# Patient Record
Sex: Male | Born: 1995 | Race: Black or African American | Hispanic: No | Marital: Single | Smoking: Former smoker
Health system: Southern US, Community
[De-identification: ages and names within clinical notes are randomized; demographics above are authoritative.]

## PROBLEM LIST (undated history)

## (undated) DIAGNOSIS — I10 Essential (primary) hypertension: Secondary | ICD-10-CM

---

## 2017-02-09 ENCOUNTER — Ambulatory Visit: Payer: Self-pay | Admitting: Family Medicine

## 2017-02-26 ENCOUNTER — Emergency Department
Admission: EM | Admit: 2017-02-26 | Discharge: 2017-02-26 | Disposition: A | Discharge status: Home / Self Care | Payer: Self-pay | Attending: Emergency Medicine | Admitting: Emergency Medicine

## 2017-02-26 ENCOUNTER — Encounter: Payer: Self-pay | Admitting: Emergency Medicine

## 2017-02-26 ENCOUNTER — Emergency Department: Payer: Self-pay

## 2017-02-26 DIAGNOSIS — F17200 Nicotine dependence, unspecified, uncomplicated: Secondary | ICD-10-CM | POA: Insufficient documentation

## 2017-02-26 DIAGNOSIS — I10 Essential (primary) hypertension: Secondary | ICD-10-CM | POA: Insufficient documentation

## 2017-02-26 DIAGNOSIS — J9801 Acute bronchospasm: Secondary | ICD-10-CM | POA: Insufficient documentation

## 2017-02-26 HISTORY — DX: Essential (primary) hypertension: I10

## 2017-02-26 MED ORDER — MONTELUKAST SODIUM 10 MG PO TABS: 10.0000 mg | ORAL_TABLET | Freq: Every day | ORAL | 0 refills | Status: DC

## 2017-02-26 MED ORDER — PREDNISONE 20 MG PO TABS: 40.0000 mg | ORAL_TABLET | Freq: Every day | ORAL | 0 refills | Status: DC

## 2017-02-26 MED ORDER — ALBUTEROL SULFATE HFA 108 (90 BASE) MCG/ACT IN AERS: 2.0000 | INHALATION_SPRAY | RESPIRATORY_TRACT | 0 refills | Status: DC | PRN

## 2017-02-26 MED ORDER — ALBUTEROL SULFATE (2.5 MG/3ML) 0.083% IN NEBU
5.0000 mg | INHALATION_SOLUTION | Freq: Once | RESPIRATORY_TRACT | Status: AC
  Administered 2017-02-26: 5 mg via RESPIRATORY_TRACT

## 2017-02-26 NOTE — ED Triage Notes (Addendum)
Pt c/o SHOB since getting stung by a bee 2 days ago.  Unlabored at this time.  Controlling secretions mild tachypnea.  Expiratory wheezing on ausculation; no stridor or inspiratory wheezing.  No hx asthma. 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 Denies any pain. Denies cough

## 2017-02-26 NOTE — ED Provider Notes (Signed)
[facility] Emergency Department Provider Note  ____________________________________________  Time seen: Approximately 7:24 PM  I have reviewed the triage vital signs and the nursing notes.   HISTORY  Chief Complaint Shortness of Breath    HPI Sean Terry is a 21 y.o. male who complains of wheezing and chest tightness that started while he was cutting grass and using a weed eater. He does report seasonal allergies related to pollens. 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 He also reports any stung by a bee 2 days ago without any hives or rash or vomiting. No belly pain.     Past Medical History:  Diagnosis Date  . Hypertension      There are no active problems to display for this patient.    History reviewed. No pertinent surgical history.   Prior to Admission medications   Medication Sig Start Date End Date Taking? Authorizing Provider  albuterol (PROVENTIL HFA) 108 (90 Base) MCG/ACT inhaler Inhale 2 puffs into the lungs every 4 (four) hours as needed for wheezing or shortness of breath. 02/26/17   Sharman CheekStafford, Gavriella Hearst, MD  montelukast (SINGULAIR) 10 MG tablet Take 1 tablet (10 mg total) by mouth at bedtime. 02/26/17   Sharman CheekStafford, Khaleb Broz, MD  predniSONE (DELTASONE) 20 MG tablet Take 2 tablets (40 mg total) by mouth daily. 02/26/17   Sharman CheekStafford, Sherby Moncayo, MD     Allergies Patient has no known allergies.   History reviewed. No pertinent family history.  Social History Social History  Substance Use Topics  . Smoking status: Current Some Day Smoker  . Smokeless tobacco: Never Used  . Alcohol use No    Review of Systems  Constitutional:   No fever or chills.  ENT:   No sore throat. No rhinorrhea. Lymphatic: No swollen glands, No extremity swelling Endocrine: No hot/cold flashes. No significant weight change. No neck  swelling. Cardiovascular:   No chest pain or syncope. Respiratory:   Positive shortness of breath and nonproductive cough. Gastrointestinal:   Negative for abdominal pain, vomiting and diarrhea.  Genitourinary:   Negative for dysuria or difficulty urinating. Musculoskeletal:   Negative for focal pain or swelling Neurological:   Negative for headaches or weakness. All other systems reviewed and are negative except as documented above in ROS and HPI.  ____________________________________________   PHYSICAL EXAM:  VITAL SIGNS: ED Triage Vitals [02/26/17 1657]  Enc Vitals Group     BP (!) 151/85     Pulse Rate 99     Resp (!) 24     Temp 98.7 F (37.1 C)     Temp Source Oral     SpO2 94 %     Weight 240 lb (108.9 kg)     Height 5\' 10"  (1.778 m)     Head Circumference      Peak Flow      Pain Score      Pain Loc      Pain Edu?      Excl. in GC?     Vital signs reviewed, nursing assessments reviewed.   Constitutional:   Alert and oriented. Well appearing and in no distress. Eyes:   No scleral icterus. No conjunctival pallor. PERRL. EOMI.  No nystagmus. ENT   Head:   Normocephalic and atraumatic.   Nose:   No congestion/rhinnorhea. No septal hematoma   Mouth/Throat:  MMM, no pharyngeal erythema. No peritonsillar mass.    Neck:   No stridor. No SubQ emphysema. No meningismus. Hematological/Lymphatic/Immunilogical:   No cervical lymphadenopathy. Cardiovascular:   RRR. Symmetric bilateral radial and DP pulses.  No murmurs.  Respiratory:   Normal respiratory effort without tachypnea nor retractions. Diffuse expiratory wheezing with mildly prolonged expiratory phase. Exacerbated with forceful expiration. No focal crackles. Gastrointestinal:   Soft and nontender. Non distended. There is no CVA tenderness.  No rebound, rigidity, or guarding. Genitourinary:   deferred Musculoskeletal:   Normal range of motion in all extremities. No joint effusions.  No lower extremity  tenderness.  No edema. Neurologic:   Normal speech and language.  CN 2-10 normal. Motor grossly intact. No gross focal neurologic deficits are appreciated.  Skin:    Skin is warm, dry and intact. No rash noted.  No petechiae, purpura, or bullae.  ____________________________________________    LABS (pertinent positives/negatives) (all labs ordered are listed, but only abnormal results are displayed) Labs Reviewed - No data to display ____________________________________________   EKG    ____________________________________________    RADIOLOGY  Dg Chest 2 View  Result Date: 02/26/2017 CLINICAL DATA:  Shortness of breath EXAM: CHEST  2 VIEW COMPARISON:  None. FINDINGS: Normal heart size and mediastinal contours. No acute infiltrate or edema. No effusion or pneumothorax. No acute osseous findings. IMPRESSION: Negative chest. Electronically Signed   By: Marnee Spring M.D.   On: 02/26/2017 17:55    ____________________________________________   PROCEDURES Procedures  ____________________________________________   INITIAL IMPRESSION / ASSESSMENT AND PLAN / ED COURSE  Pertinent labs & imaging results that were available during my care of the patient were reviewed by me and considered in my medical decision making (see chart for details).  Pt well appearing, no acute distress, presentation not consistent with anaphylaxis or hypersensitivity. This appears to be bronchospasm related to seasonal allergies and possible asthma. Feeling better currently after prednisone and albuterol treatment. I'll continue him on prednisone singular and albuterol, follow-up with primary care. He has an appointment on May 30.  Considering the patient's symptoms, medical history, and physical examination today, I have low suspicion for ACS, PE, TAD, pneumothorax, carditis, mediastinitis, pneumonia, CHF, or sepsis.           ____________________________________________   FINAL CLINICAL  IMPRESSION(S) / ED DIAGNOSES  Final diagnoses:  Acute bronchospasm      New Prescriptions   ALBUTEROL (PROVENTIL HFA) 108 (90 BASE) MCG/ACT INHALER    Inhale 2 puffs into the lungs every 4 (four) hours as needed for wheezing or shortness of breath.   MONTELUKAST (SINGULAIR) 10 MG TABLET    Take 1 tablet (10 mg total) by mouth at bedtime.   PREDNISONE (DELTASONE) 20 MG TABLET    Take 2 tablets (40 mg total) by mouth daily.     Portions of this note were generated with dragon dictation software. Dictation errors may occur despite best attempts at proofreading.    Sharman Cheek, MD 02/26/17 [phone]

## 2017-02-26 NOTE — ED Triage Notes (Signed)
Pt remains to have expiratory wheezing after treatment and feels like lungs are tight. Does have bad allergie to pollen per pt. Remains without increased WOB.  Discussed with dr Suzanne Boronstaffored; will order CXR and wait for main/cpod.

## 2017-04-07 ENCOUNTER — Encounter: Payer: Self-pay | Admitting: Emergency Medicine

## 2017-04-07 ENCOUNTER — Emergency Department: Payer: Self-pay

## 2017-04-07 ENCOUNTER — Emergency Department
Admission: EM | Admit: 2017-04-07 | Discharge: 2017-04-07 | Disposition: A | Discharge status: Home / Self Care | Payer: Self-pay | Attending: Emergency Medicine | Admitting: Emergency Medicine

## 2017-04-07 DIAGNOSIS — R109 Unspecified abdominal pain: Secondary | ICD-10-CM

## 2017-04-07 DIAGNOSIS — F17200 Nicotine dependence, unspecified, uncomplicated: Secondary | ICD-10-CM | POA: Insufficient documentation

## 2017-04-07 DIAGNOSIS — I10 Essential (primary) hypertension: Secondary | ICD-10-CM | POA: Insufficient documentation

## 2017-04-07 DIAGNOSIS — R1032 Left lower quadrant pain: Secondary | ICD-10-CM | POA: Insufficient documentation

## 2017-04-07 LAB — COMPREHENSIVE METABOLIC PANEL
ALBUMIN: 4.5 g/dL (ref 3.5–5.0)
ALK PHOS: 84 U/L (ref 38–126)
ALT: 36 U/L (ref 17–63)
AST: 38 U/L (ref 15–41)
Anion gap: 6 (ref 5–15)
BUN: 12 mg/dL (ref 6–20)
CALCIUM: 9.5 mg/dL (ref 8.9–10.3)
CO2: 26 mmol/L (ref 22–32)
CREATININE: 1.1 mg/dL (ref 0.61–1.24)
Chloride: 104 mmol/L (ref 101–111)
GFR calc Af Amer: >60 mL/min (ref >60)
GFR calc non Af Amer: >60 mL/min (ref >60)
GLUCOSE: 112 mg/dL — AB (ref 65–99)
Potassium: 4 mmol/L (ref 3.5–5.1)
SODIUM: 136 mmol/L (ref 135–145)
Total Bilirubin: 0.7 mg/dL (ref 0.3–1.2)
Total Protein: 7.8 g/dL (ref 6.5–8.1)

## 2017-04-07 LAB — CBC
HCT: 45.3 % (ref 40.0–52.0)
Hemoglobin: 15.6 g/dL (ref 13.0–18.0)
MCH: 30.5 pg (ref 26.0–34.0)
MCHC: 34.3 g/dL (ref 32.0–36.0)
MCV: 88.9 fL (ref 80.0–100.0)
PLATELETS: 241 10*3/uL (ref 150–440)
RBC: 5.1 MIL/uL (ref 4.40–5.90)
RDW: 13.8 % (ref 11.5–14.5)
WBC: 5.1 10*3/uL (ref 3.8–10.6)

## 2017-04-07 LAB — URINALYSIS, COMPLETE (UACMP) WITH MICROSCOPIC
Bacteria, UA: NONE SEEN
Bilirubin Urine: NEGATIVE
GLUCOSE, UA: NEGATIVE mg/dL
Ketones, ur: NEGATIVE mg/dL
Leukocytes, UA: NEGATIVE
Nitrite: NEGATIVE
PROTEIN: NEGATIVE mg/dL
Specific Gravity, Urine: 1.025 (ref 1.005–1.030)
Squamous Epithelial / LPF: NONE SEEN
pH: 5 (ref 5.0–8.0)

## 2017-04-07 LAB — LIPASE, BLOOD: Lipase: 46 U/L (ref 11–51)

## 2017-04-07 MED ORDER — OXYCODONE-ACETAMINOPHEN 5-325 MG PO TABS
2.0000 | ORAL_TABLET | Freq: Once | ORAL | Status: AC
  Administered 2017-04-07: 2 via ORAL
  Filled 2017-04-07 (×2): qty 2

## 2017-04-07 NOTE — ED Provider Notes (Signed)
[facility] Emergency Department Provider Note       Time seen: ----------------------------------------- 9:12 AM on 04/07/2017 -----------------------------------------     I have reviewed the triage vital signs and the nursing notes.   HISTORY   Chief Complaint Abdominal Pain    HPI Sean Terry is a 21 y.o. male who presents to the ED for left-sided abdominal pain. Patient describes sharp and stabbing pain in the left flank, nothing makes it better or worse. He has had this happen before and he was told there was something wrong with his kidneys and that he was in kidney failure. Eventually this improved. He has had a history of hypertension and diabetes potentially as well. He denies any recent illness, denies being dehydrated. He denies any physical activity that would've pulled a muscle.   Past Medical History:  Diagnosis Date  . Hypertension     There are no active problems to display for this patient.   History reviewed. No pertinent surgical history.  Allergies Patient has no known allergies.  Social History Social History  Substance Use Topics  . Smoking status: Current Some Day Smoker  . Smokeless tobacco: Never Used  . Alcohol use No    Review of Systems Constitutional: Negative for fever. Cardiovascular: Negative for chest pain. Respiratory: Negative for shortness of breath. Gastrointestinal:Positive for left flank pain Genitourinary: Negative for dysuria. Musculoskeletal: Negative for back pain. Skin: Negative for rash. Neurological: Negative for headaches, focal weakness or numbness.  All systems negative/normal/unremarkable except as stated in the HPI  ____________________________________________   PHYSICAL EXAM:  VITAL SIGNS: ED Triage Vitals  Enc Vitals Group     BP 04/07/17 0851 133/63     Pulse Rate 04/07/17 0851 85     Resp 04/07/17 0851 15     Temp 04/07/17 0851 98.5 F (36.9 C)     Temp Source  04/07/17 0851 Oral     SpO2 04/07/17 0851 100 %     Weight 04/07/17 0850 240 lb (108.9 kg)     Height 04/07/17 0852 5\' 10"  (1.778 m)     Head Circumference --      Peak Flow --      Pain Score 04/07/17 0850 6     Pain Loc --      Pain Edu? --      Excl. in GC? --    Constitutional: Alert and oriented. Well appearing and in no distress. Eyes: Conjunctivae are normal. Normal extraocular movements. ENT   Head: Normocephalic and atraumatic.   Nose: No congestion/rhinnorhea.   Mouth/Throat: Mucous membranes are moist.   Neck: No stridor. Cardiovascular: Normal rate, regular rhythm. No murmurs, rubs, or gallops. Respiratory: Normal respiratory effort without tachypnea nor retractions. Breath sounds are clear and equal bilaterally. No wheezes/rales/rhonchi. Gastrointestinal: Soft and nontender. Normal bowel sounds Musculoskeletal: Nontender with normal range of motion in extremities. No lower extremity tenderness nor edema. Neurologic:  Normal speech and language. No gross focal neurologic deficits are appreciated.  Skin:  Skin is warm, dry and intact. No rash noted. Psychiatric: Mood and affect are normal. Speech and behavior are normal.  ____________________________________________  ED COURSE:  Pertinent labs & imaging results that were available during my care of the patient were reviewed by me and considered in my medical decision making (see chart for details). Patient presents for flank pain, we will assess with labs and imaging as indicated.   Procedures ____________________________________________   LABS (pertinent positives/negatives)  Labs Reviewed  COMPREHENSIVE METABOLIC PANEL -  Abnormal; Notable for the following:       Result Value   Glucose, Bld 112 (*)    All other components within normal limits  URINALYSIS, COMPLETE (UACMP) WITH MICROSCOPIC - Abnormal; Notable for the following:    Hgb urine dipstick TRACE (*)    All other components within normal  limits  LIPASE, BLOOD  CBC    RADIOLOGY Images were viewed by me  CT renal protocol IMPRESSION: 1. No acute abdominal or pelvic pathology. 2. No urolithiasis or obstructive uropathy. ____________________________________________  FINAL ASSESSMENT AND PLAN  Flank pain  Plan: Patient's labs and imaging were dictated above. Patient had presented for flank pain of uncertain etiology. No specific etiology was discovered. She is stable for outpatient follow-up.   Emily Filbert, MD   Note: This note was generated in part or whole with voice recognition software. Voice recognition is usually quite accurate but there are transcription errors that can and very often do occur. I apologize for any typographical errors that were not detected and corrected.     Emily Filbert, MD 04/07/17 1036

## 2017-04-07 NOTE — ED Notes (Signed)
Pt states he drove himself to ER, unable to verify other means of transportation at D/C. Will let RN know when patient has secured a ride home and pain medication will be administered. MD aware. Pt states understanding.

## 2017-04-07 NOTE — ED Notes (Signed)
NAD noted at time of D/C. Pt denies questions or concerns. Pt ambulatory to the lobby at this time.  

## 2017-04-07 NOTE — ED Triage Notes (Signed)
Pt presents with left side abd pain .

## 2018-12-25 ENCOUNTER — Emergency Department
Admission: EM | Admit: 2018-12-25 | Discharge: 2018-12-25 | Disposition: A | Discharge status: Home / Self Care | Payer: Self-pay | Attending: Emergency Medicine | Admitting: Emergency Medicine

## 2018-12-25 ENCOUNTER — Encounter: Payer: Self-pay | Admitting: Emergency Medicine

## 2018-12-25 ENCOUNTER — Emergency Department: Payer: Self-pay

## 2018-12-25 ENCOUNTER — Other Ambulatory Visit: Payer: Self-pay

## 2018-12-25 DIAGNOSIS — F17200 Nicotine dependence, unspecified, uncomplicated: Secondary | ICD-10-CM | POA: Insufficient documentation

## 2018-12-25 DIAGNOSIS — Y998 Other external cause status: Secondary | ICD-10-CM | POA: Insufficient documentation

## 2018-12-25 DIAGNOSIS — I10 Essential (primary) hypertension: Secondary | ICD-10-CM | POA: Insufficient documentation

## 2018-12-25 DIAGNOSIS — Y929 Unspecified place or not applicable: Secondary | ICD-10-CM | POA: Insufficient documentation

## 2018-12-25 DIAGNOSIS — W109XXA Fall (on) (from) unspecified stairs and steps, initial encounter: Secondary | ICD-10-CM | POA: Insufficient documentation

## 2018-12-25 DIAGNOSIS — S93402A Sprain of unspecified ligament of left ankle, initial encounter: Secondary | ICD-10-CM | POA: Insufficient documentation

## 2018-12-25 DIAGNOSIS — Y9301 Activity, walking, marching and hiking: Secondary | ICD-10-CM | POA: Insufficient documentation

## 2018-12-25 NOTE — Discharge Instructions (Addendum)
Your exam and x-ray are consistent with a Grade II-III sprain. Wear the ankle stirrup or your home boot for support. Perform ROM exercises as discussed. Take OTC ibuprofen or Naproxen for pain & swelling. Follow-up with Podiatry for ongoing symptoms.

## 2018-12-25 NOTE — ED Provider Notes (Signed)
[facility] Emergency Department Provider Note ____________________________________________  Time seen: 1833  I have reviewed the triage vital signs and the nursing notes.  HISTORY  Chief Complaint  Ankle Pain  HPI Sean Terry is a 23 y.o. male presents himself to the ED for evaluation of pain to the left ankle.  Patient describes a mechanical fall while walking down the steps with his toddler in arms.  He rolled his ankle, and has had increasing pain, swelling, and disability to the ankle in the last 3 days.  He denies any other injury at this time.  Past Medical History:  Diagnosis Date  . Hypertension     There are no active problems to display for this patient.  History reviewed. No pertinent surgical history.  Prior to Admission medications   Not on File   Allergies Patient has no known allergies.  No family history on file.  Social History Social History   Tobacco Use  . Smoking status: Current Some Day Smoker  . Smokeless tobacco: Never Used  Substance Use Topics  . Alcohol use: No  . Drug use: No    Review of Systems  Constitutional: Negative for fever. Cardiovascular: Negative for chest pain. Respiratory: Negative for shortness of breath. Musculoskeletal: Negative for back pain.  Ankle pain as above. Skin: Negative for rash. Neurological: Negative for headaches, focal weakness or numbness. ____________________________________________  PHYSICAL EXAM:  VITAL SIGNS: ED Triage Vitals  Enc Vitals Group     BP 12/25/18 1718 (!) 152/96     Pulse Rate 12/25/18 1718 88     Resp 12/25/18 1718 16     Temp 12/25/18 1718 98.2 F (36.8 C)     Temp Source 12/25/18 1718 Oral     SpO2 12/25/18 1718 97 %     Weight 12/25/18 1719 225 lb (102.1 kg)     Height 12/25/18 1719 5\' 9"  (1.753 m)     Head Circumference --      Peak Flow --      Pain Score 12/25/18 1719 0     Pain Loc --      Pain Edu? --      Excl. in GC? --      Constitutional: Alert and oriented. Well appearing and in no distress. Head: Normocephalic and atraumatic. Eyes: Conjunctivae are normal. Normal extraocular movements Cardiovascular: Normal rate, regular rhythm. Normal distal pulses capillary refill. Respiratory: Normal respiratory effort.  Musculoskeletal: Left foot and ankle with moderate soft tissue swelling and lateral ecchymosis noted.  Patient with decreased active range of motion ankle with all planes secondary to pain and swelling.  No calf or Achilles tenderness is elicited.  Negative anterior/posterior drawer.  Nontender with normal range of motion in all extremities.  Neurologic: Antalgic gait without ataxia. Normal speech and language. No gross focal neurologic deficits are appreciated. Skin:  Skin is warm, dry and intact. No rash noted. ____________________________________________   RADIOLOGY  Left Ankle  Negative ____________________________________________  PROCEDURES  Procedures Velcro Ankle Stirrup Splint Crutches ____________________________________________  INITIAL IMPRESSION / ASSESSMENT AND PLAN / ED COURSE  Patient with ED evaluation of left ankle pain as well as following mechanical fall.  Sustained a grade 2-3 sprain to the left ankle.  X-rays are reassuring as it shows no acute fracture or dislocation.  Patient is placed in a Velcro stirrup splint and given crutches to ambulate with weightbearing as tolerated.  He is referred to podiatry for ongoing symptomatic.  A work note is provided for  2 days as requested.  Take over-the-counter ibuprofen or naproxen as needed. ____________________________________________  FINAL CLINICAL IMPRESSION(S) / ED DIAGNOSES  Final diagnoses:  Sprain of left ankle, unspecified ligament, initial encounter      Lissa Hoard, PA-C 12/25/18 1852    Phineas Semen, MD 12/25/18 2042

## 2018-12-25 NOTE — ED Triage Notes (Signed)
Pt to ED via POV c/o left ankle pain s/p fall 2 days ago. Pt states that pain has continued to get worse. Pt states that he has increased pain when trying to put pressure on foot. Pt is in NAD.

## 2020-07-13 ENCOUNTER — Other Ambulatory Visit: Payer: Self-pay

## 2020-07-13 ENCOUNTER — Emergency Department: Payer: Self-pay

## 2020-07-13 ENCOUNTER — Emergency Department
Admission: EM | Admit: 2020-07-13 | Discharge: 2020-07-13 | Disposition: A | Discharge status: Home / Self Care | Payer: Self-pay | Attending: Emergency Medicine | Admitting: Emergency Medicine

## 2020-07-13 DIAGNOSIS — Z5321 Procedure and treatment not carried out due to patient leaving prior to being seen by health care provider: Secondary | ICD-10-CM | POA: Insufficient documentation

## 2020-07-13 DIAGNOSIS — R079 Chest pain, unspecified: Secondary | ICD-10-CM | POA: Insufficient documentation

## 2020-07-13 DIAGNOSIS — I10 Essential (primary) hypertension: Secondary | ICD-10-CM | POA: Insufficient documentation

## 2020-07-13 LAB — BASIC METABOLIC PANEL
Anion gap: 10 (ref 5–15)
BUN: 17 mg/dL (ref 6–20)
CO2: 26 mmol/L (ref 22–32)
Calcium: 9.8 mg/dL (ref 8.9–10.3)
Chloride: 103 mmol/L (ref 98–111)
Creatinine, Ser: 1.42 mg/dL — ABNORMAL HIGH (ref 0.61–1.24)
GFR calc Af Amer: >60 mL/min (ref >60)
GFR calc non Af Amer: >60 mL/min (ref >60)
Glucose, Bld: 100 mg/dL — ABNORMAL HIGH (ref 70–99)
Potassium: 4.4 mmol/L (ref 3.5–5.1)
Sodium: 139 mmol/L (ref 135–145)

## 2020-07-13 LAB — CBC
HCT: 45 % (ref 39.0–52.0)
Hemoglobin: 16 g/dL (ref 13.0–17.0)
MCH: 31.1 pg (ref 26.0–34.0)
MCHC: 35.6 g/dL (ref 30.0–36.0)
MCV: 87.5 fL (ref 80.0–100.0)
Platelets: 277 10*3/uL (ref 150–400)
RBC: 5.14 MIL/uL (ref 4.22–5.81)
RDW: 12.6 % (ref 11.5–15.5)
WBC: 8.2 10*3/uL (ref 4.0–10.5)
nRBC: 0 % (ref 0.0–0.2)

## 2020-07-13 LAB — TROPONIN I (HIGH SENSITIVITY): Troponin I (High Sensitivity): 4 ng/L (ref <18)

## 2020-07-13 NOTE — ED Notes (Signed)
No answer when called for repeat troponin.  

## 2020-07-13 NOTE — ED Triage Notes (Signed)
Pt comes POV with chest pain for 2 days . Pt concerned about htn coming back, used to be on meds but got taken off. States felt like a muscle spasm in his chest that made him almost vomit.

## 2020-07-13 NOTE — ED Notes (Signed)
No answer when called 

## 2020-07-14 ENCOUNTER — Emergency Department
Admission: EM | Admit: 2020-07-14 | Discharge: 2020-07-14 | Disposition: A | Discharge status: Home / Self Care | Payer: Self-pay | Attending: Emergency Medicine | Admitting: Emergency Medicine

## 2020-07-14 ENCOUNTER — Other Ambulatory Visit: Payer: Self-pay

## 2020-07-14 ENCOUNTER — Encounter: Payer: Self-pay | Admitting: Emergency Medicine

## 2020-07-14 DIAGNOSIS — I10 Essential (primary) hypertension: Secondary | ICD-10-CM | POA: Insufficient documentation

## 2020-07-14 DIAGNOSIS — F17200 Nicotine dependence, unspecified, uncomplicated: Secondary | ICD-10-CM | POA: Insufficient documentation

## 2020-07-14 DIAGNOSIS — Z20822 Contact with and (suspected) exposure to covid-19: Secondary | ICD-10-CM | POA: Insufficient documentation

## 2020-07-14 DIAGNOSIS — J029 Acute pharyngitis, unspecified: Secondary | ICD-10-CM | POA: Insufficient documentation

## 2020-07-14 LAB — GROUP A STREP BY PCR: Group A Strep by PCR: NOT DETECTED

## 2020-07-14 LAB — RESPIRATORY PANEL BY RT PCR (FLU A&B, COVID)
Influenza A by PCR: NEGATIVE
Influenza B by PCR: NEGATIVE
SARS Coronavirus 2 by RT PCR: NEGATIVE

## 2020-07-14 NOTE — ED Notes (Signed)
Pt c/o "swollen uvula" causing irritation and cough. Pt denies fever, N/V/D, exposure to illness. Pt states he and his friends were recently tested for covid, which was negative.

## 2020-07-14 NOTE — ED Provider Notes (Signed)
[facility] Emergency Department Provider Note  ____________________________________________   First MD Initiated Contact with Patient 07/14/20 1537     (approximate)  I have reviewed the triage vital signs and the nursing notes.   HISTORY  Chief Complaint Sore Throat  HPI Sean Terry is a 24 y.o. male for report of feeling like his uvula is swollen.  Of note, he came to the emergency room last night and was complaining of chest pain but denies that at this time.  He has been trying to treat his sore throat with over-the-counter lozenges.  He states that has been going on for 4 days and tried to treat it with some old medicine that he had for strep throat.  He states he begins with associated is unsure exactly what it is.  He denies headache, denies chest pain, denies shortness of breath, denies abdominal pain.  Denies known sick contacts.         Past Medical History:  Diagnosis Date  . Hypertension     There are no problems to display for this patient.   History reviewed. No pertinent surgical history.  Prior to Admission medications   Not on File    Allergies Patient has no known allergies.  History reviewed. No pertinent family history.  Social History Social History   Tobacco Use  . Smoking status: Current Some Day Smoker  . Smokeless tobacco: Never Used  Substance Use Topics  . Alcohol use: No  . Drug use: No    Review of Systems Constitutional: No fever/chills Eyes: No visual changes. ENT: + sore throat. Cardiovascular: Denies chest pain. Respiratory: Denies shortness of breath. Gastrointestinal: No abdominal pain.  No nausea, no vomiting.  No diarrhea.  No constipation. Genitourinary: Negative for dysuria. Musculoskeletal: Negative for back pain. Skin: Negative for rash. Neurological: Negative for headaches, focal weakness or numbness.  ____________________________________________   PHYSICAL EXAM:  VITAL  SIGNS: ED Triage Vitals  Enc Vitals Group     BP 07/14/20 1359 (!) 137/97     Pulse Rate 07/14/20 1359 (!) 102     Resp 07/14/20 1359 18     Temp 07/14/20 1359 98.2 F (36.8 C)     Temp Source 07/14/20 1359 Oral     SpO2 07/14/20 1359 100 %     Weight 07/14/20 1400 240 lb (108.9 kg)     Height 07/14/20 1400 5\' 9"  (1.753 m)     Head Circumference --      Peak Flow --      Pain Score 07/14/20 1405 7     Pain Loc --      Pain Edu? --      Excl. in GC? --     Constitutional: Alert and oriented. Well appearing and in no acute distress. Eyes: Conjunctivae are normal. PERRL. EOMI. Head: Atraumatic. Nose: No congestion/rhinnorhea. Mouth/Throat: Mucous membranes are moist.  Oropharynx non-erythematous.  No exudates.  Tonsils are appropriate size. Neck: No stridor.  No cervical lymphadenopathy. Cardiovascular: Normal rate, regular rhythm. Grossly normal heart sounds.  Good peripheral circulation. Respiratory: Normal respiratory effort.  No retractions. Lungs CTAB. Gastrointestinal: Soft and nontender. No distention. No abdominal bruits. No CVA tenderness. Musculoskeletal: No lower extremity tenderness nor edema.  No joint effusions. Neurologic:  Normal speech and language. No gross focal neurologic deficits are appreciated. No gait instability. Skin:  Skin is warm, dry and intact. No rash noted. Psychiatric: Mood and affect are normal. Speech and behavior are normal.  ____________________________________________  LABS (all labs ordered are listed, but only abnormal results are displayed)  Labs Reviewed  RESPIRATORY PANEL BY RT PCR (FLU A&B, COVID)  GROUP A STREP BY PCR    ____________________________________________  RADIOLOGY  Official radiology report(s): DG Chest 2 View  Result Date: 07/13/2020 CLINICAL DATA:  Chest pain. EXAM: CHEST - 2 VIEW COMPARISON:  Feb 26, 2017 FINDINGS: The heart size and mediastinal contours are within normal limits. Both lungs are clear. The  visualized skeletal structures are unremarkable. IMPRESSION: No active cardiopulmonary disease. Electronically Signed   By: Katherine Mantle M.D.   On: 07/13/2020 17:59    ____________________________________________   INITIAL IMPRESSION / ASSESSMENT AND PLAN / ED COURSE  As part of my medical decision making, I reviewed the following data within the electronic MEDICAL RECORD NUMBER Nursing notes reviewed and incorporated and Notes from prior ED visits        Sean Terry is a 24 year old male who presents to the emergency department for feeling like his uvula is swollen.  He feels that this is similar to when he had strep pharyngitis in the past.  He has been trying to treat with old antibiotics that he had but feels that is not improving between that and symptomatic therapy.  Of note he denies a cough and denies sick contacts.  Physical exam is completely normal with no erythematous oropharynx and no tonsillar exudate.  The patient does not have any cervical lymphadenopathy, making strep pharyngitis extremely unlikely.  However will test for for group A strep.  We will also test for Covid.  Both of these tests returned negative.  Is likely to be a viral pharyngitis given the negative test.  Recommended that the patient treat symptomatically, taking orally with ibuprofen as needed for his throat.  The patient is amenable with this plan will return to the emergency department for any worsening.      ____________________________________________   FINAL CLINICAL IMPRESSION(S) / ED DIAGNOSES  Final diagnoses:  Viral pharyngitis     ED Discharge Orders    None      *Please note:  Sean Terry was evaluated in Emergency Department on 07/14/2020 for the symptoms described in the history of present illness. He was evaluated in the context of the global COVID-19 pandemic, which necessitated consideration that the patient might be at risk for infection with the SARS-CoV-2 virus that causes  COVID-19. Institutional protocols and algorithms that pertain to the evaluation of patients at risk for COVID-19 are in a state of rapid change based on information released by regulatory bodies including the CDC and federal and state organizations. These policies and algorithms were followed during the patient's care in the ED.  Some ED evaluations and interventions may be delayed as a result of limited staffing during and the pandemic.*   Note:  This document was prepared using Dragon voice recognition software and may include unintentional dictation errors.    Lucy Chris, PA 07/14/20 1743    Merwyn Katos, MD 07/14/20 [phone]

## 2020-07-14 NOTE — ED Triage Notes (Signed)
Pt arrived via POV with c/o sore throat x 4 days, pt states he thinks his uvula is swollen. Pt states he took left over antibiotics at home for a few days that starts with a C but states no improvement.  Uvula does not appear to be swollen at this time when examined with light.

## 2020-07-17 ENCOUNTER — Other Ambulatory Visit: Payer: Self-pay

## 2020-07-17 ENCOUNTER — Encounter: Payer: Self-pay | Admitting: Emergency Medicine

## 2020-07-17 ENCOUNTER — Emergency Department
Admission: EM | Admit: 2020-07-17 | Discharge: 2020-07-17 | Disposition: A | Discharge status: Home / Self Care | Payer: Self-pay | Attending: Student in an Organized Health Care Education/Training Program | Admitting: Student in an Organized Health Care Education/Training Program

## 2020-07-17 ENCOUNTER — Emergency Department: Payer: Self-pay

## 2020-07-17 DIAGNOSIS — I10 Essential (primary) hypertension: Secondary | ICD-10-CM | POA: Insufficient documentation

## 2020-07-17 DIAGNOSIS — F17200 Nicotine dependence, unspecified, uncomplicated: Secondary | ICD-10-CM | POA: Insufficient documentation

## 2020-07-17 DIAGNOSIS — J029 Acute pharyngitis, unspecified: Secondary | ICD-10-CM | POA: Insufficient documentation

## 2020-07-17 LAB — CBC WITH DIFFERENTIAL/PLATELET
Abs Immature Granulocytes: 0.01 10*3/uL (ref 0.00–0.07)
Basophils Absolute: 0 10*3/uL (ref 0.0–0.1)
Basophils Relative: 1 %
Eosinophils Absolute: 0 10*3/uL (ref 0.0–0.5)
Eosinophils Relative: 0 %
HCT: 41.2 % (ref 39.0–52.0)
Hemoglobin: 14.5 g/dL (ref 13.0–17.0)
Immature Granulocytes: 0 %
Lymphocytes Relative: 30 %
Lymphs Abs: 1.6 10*3/uL (ref 0.7–4.0)
MCH: 31.3 pg (ref 26.0–34.0)
MCHC: 35.2 g/dL (ref 30.0–36.0)
MCV: 89 fL (ref 80.0–100.0)
Monocytes Absolute: 0.4 10*3/uL (ref 0.1–1.0)
Monocytes Relative: 7 %
Neutro Abs: 3.4 10*3/uL (ref 1.7–7.7)
Neutrophils Relative %: 62 %
Platelets: 234 10*3/uL (ref 150–400)
RBC: 4.63 MIL/uL (ref 4.22–5.81)
RDW: 12.4 % (ref 11.5–15.5)
WBC: 5.4 10*3/uL (ref 4.0–10.5)
nRBC: 0 % (ref 0.0–0.2)

## 2020-07-17 LAB — COMPREHENSIVE METABOLIC PANEL
ALT: 23 U/L (ref 0–44)
AST: 26 U/L (ref 15–41)
Albumin: 4.7 g/dL (ref 3.5–5.0)
Alkaline Phosphatase: 68 U/L (ref 38–126)
Anion gap: 9 (ref 5–15)
BUN: 14 mg/dL (ref 6–20)
CO2: 24 mmol/L (ref 22–32)
Calcium: 9.7 mg/dL (ref 8.9–10.3)
Chloride: 106 mmol/L (ref 98–111)
Creatinine, Ser: 1.35 mg/dL — ABNORMAL HIGH (ref 0.61–1.24)
GFR calc Af Amer: >60 mL/min (ref >60)
GFR calc non Af Amer: >60 mL/min (ref >60)
Glucose, Bld: 110 mg/dL — ABNORMAL HIGH (ref 70–99)
Potassium: 3.5 mmol/L (ref 3.5–5.1)
Sodium: 139 mmol/L (ref 135–145)
Total Bilirubin: 1 mg/dL (ref 0.3–1.2)
Total Protein: 7.8 g/dL (ref 6.5–8.1)

## 2020-07-17 LAB — TSH: TSH: 0.492 u[IU]/mL (ref 0.350–4.500)

## 2020-07-17 LAB — MONONUCLEOSIS SCREEN: Mono Screen: NEGATIVE

## 2020-07-17 LAB — TROPONIN I (HIGH SENSITIVITY)
Troponin I (High Sensitivity): 4 ng/L (ref <18)
Troponin I (High Sensitivity): <2 ng/L (ref <18)

## 2020-07-17 MED ORDER — SODIUM CHLORIDE 0.9 % IV BOLUS
1000.0000 mL | Freq: Once | INTRAVENOUS | Status: AC
  Administered 2020-07-17: 1000 mL via INTRAVENOUS

## 2020-07-17 MED ORDER — SODIUM CHLORIDE 0.9 % IV BOLUS
500.0000 mL | Freq: Once | INTRAVENOUS | Status: AC
  Administered 2020-07-17: 500 mL via INTRAVENOUS

## 2020-07-17 MED ORDER — DEXAMETHASONE SODIUM PHOSPHATE 10 MG/ML IJ SOLN
10.0000 mg | Freq: Once | INTRAMUSCULAR | Status: AC
  Administered 2020-07-17: 10 mg via INTRAVENOUS
  Filled 2020-07-17: qty 1

## 2020-07-17 NOTE — ED Triage Notes (Signed)
Pt presents to ED via POV with, states has had 2 negative covid tests. Pt is noted to be extremely diaphoretic on arrival to ED, tachy at 120. Pt A&O x4 and able to speak in complete sentences. Pt states some SOB. Pt reports feeling anxious on arrival to ED due to feeling like something is swelling up in the back of his throat.

## 2020-07-17 NOTE — ED Provider Notes (Signed)
[facility] Emergency Department Provider Note    First MD Initiated Contact with Patient 07/17/20 1141     (approximate)  I have reviewed the triage vital signs and the nursing notes.   HISTORY  Chief Complaint Sore Throat    HPI Sean Terry is a 24 y.o. male presents to the ER for evaluation of "swelling on his tongue ".  Patient states he been having sore throat for the past few days was recently tested negative for Covid twice and strep test was negative yesterday.  States that he has been checking the back of his throat in the mirror and states he looked at the back of his tongue and thought he saw something swelling so he rushed to the ER.  He denies any chest pain or shortness of breath at this time.  Did feel very anxious.  Does have some discomfort with swallowing.  He is not on lisinopril.  Has normal fine phonation.  No measured fevers.    Past Medical History:  Diagnosis Date  . Hypertension    History reviewed. No pertinent family history. History reviewed. No pertinent surgical history. There are no problems to display for this patient.     Prior to Admission medications   Medication Sig Start Date End Date Taking? Authorizing Provider  guaiFENesin (MUCINEX) 600 MG 12 hr tablet Take 1,200 mg by mouth 2 (two) times daily.   Yes [provider]    Allergies Patient has no known allergies.    Social History Social History   Tobacco Use  . Smoking status: Current Some Day Smoker  . Smokeless tobacco: Never Used  Substance Use Topics  . Alcohol use: No  . Drug use: No    Review of Systems Patient denies headaches, rhinorrhea, blurry vision, numbness, shortness of breath, chest pain, edema, cough, abdominal pain, nausea, vomiting, diarrhea, dysuria, fevers, rashes or hallucinations unless otherwise stated above in HPI. ____________________________________________   PHYSICAL EXAM:  VITAL SIGNS: Vitals:   07/17/20  1133 07/17/20 1340  BP: (!) 160/107 (!) 152/85  Pulse: (!) 124 92  Resp: 20 11  Temp: 98 F (36.7 C)   SpO2: 100% 100%    Constitutional: Alert and oriented.  Eyes: Conjunctivae are normal.  Head: Atraumatic. Nose: No congestion/rhinnorhea. Mouth/Throat: Mucous membranes are moist.  Erythematous posterior pharynx with no exudates.  No evidence of PTA no uvular edema.  Epiglottis is visible but nonedematous. Neck: No stridor. Painless ROM.  Cardiovascular: Normal rate, regular rhythm. Grossly normal heart sounds.  Good peripheral circulation. Respiratory: Normal respiratory effort.  No retractions. Lungs CTAB. Gastrointestinal: Soft and nontender. No distention. No abdominal bruits. No CVA tenderness. Genitourinary:  Musculoskeletal: No lower extremity tenderness nor edema.  No joint effusions. Neurologic:  Normal speech and language. No gross focal neurologic deficits are appreciated. No facial droop Skin:  Skin is warm, dry and intact. No rash noted. Psychiatric: Mood and affect are normal. Speech and behavior are normal.  ____________________________________________   LABS (all labs ordered are listed, but only abnormal results are displayed)  Results for orders placed or performed during the hospital encounter of 07/17/20 (from the past 24 hour(s))  CBC with Differential/Platelet     Status: None   Collection Time: 07/17/20 12:05 PM  Result Value Ref Range   WBC 5.4 4.0 - 10.5 K/uL   RBC 4.63 4.22 - 5.81 MIL/uL   Hemoglobin 14.5 13.0 - 17.0 g/dL   HCT 22.2 39 - 52 %  MCV 89.0 80.0 - 100.0 fL   MCH 31.3 26.0 - 34.0 pg   MCHC 35.2 30.0 - 36.0 g/dL   RDW 16.0 10.9 - 32.3 %   Platelets 234 150 - 400 K/uL   nRBC 0.0 0.0 - 0.2 %   Neutrophils Relative % 62 %   Neutro Abs 3.4 1.7 - 7.7 K/uL   Lymphocytes Relative 30 %   Lymphs Abs 1.6 0.7 - 4.0 K/uL   Monocytes Relative 7 %   Monocytes Absolute 0.4 0 - 1 K/uL   Eosinophils Relative 0 %   Eosinophils Absolute 0.0 0 - 0  K/uL   Basophils Relative 1 %   Basophils Absolute 0.0 0 - 0 K/uL   Immature Granulocytes 0 %   Abs Immature Granulocytes 0.01 0.00 - 0.07 K/uL  Comprehensive metabolic panel     Status: Abnormal   Collection Time: 07/17/20 12:05 PM  Result Value Ref Range   Sodium 139 135 - 145 mmol/L   Potassium 3.5 3.5 - 5.1 mmol/L   Chloride 106 98 - 111 mmol/L   CO2 24 22 - 32 mmol/L   Glucose, Bld 110 (H) 70 - 99 mg/dL   BUN 14 6 - 20 mg/dL   Creatinine, Ser 5.57 (H) 0.61 - 1.24 mg/dL   Calcium 9.7 8.9 - 32.2 mg/dL   Total Protein 7.8 6.5 - 8.1 g/dL   Albumin 4.7 3.5 - 5.0 g/dL   AST 26 15 - 41 U/L   ALT 23 0 - 44 U/L   Alkaline Phosphatase 68 38 - 126 U/L   Total Bilirubin 1.0 0.3 - 1.2 mg/dL   GFR calc non Af Amer >60 >60 mL/min   GFR calc Af Amer >60 >60 mL/min   Anion gap 9 5 - 15  Mononucleosis screen     Status: None   Collection Time: 07/17/20 12:05 PM  Result Value Ref Range   Mono Screen NEGATIVE NEGATIVE  Troponin I (High Sensitivity)     Status: None   Collection Time: 07/17/20 12:05 PM  Result Value Ref Range   Troponin I (High Sensitivity) 4 <18 ng/L  TSH     Status: None   Collection Time: 07/17/20  2:03 PM  Result Value Ref Range   TSH 0.492 0.350 - 4.500 uIU/mL  Troponin I (High Sensitivity)     Status: None   Collection Time: 07/17/20  2:03 PM  Result Value Ref Range   Troponin I (High Sensitivity) <2 <18 ng/L   ____________________________________________  EKG My review and personal interpretation at Time: 11:31   Indication: tachycardia  Rate: 115  Rhythm: sinus Axis: normal Other: nonspecific tw abn, no stemi, unchanged from previous ekg ____________________________________________  RADIOLOGY  I personally reviewed all radiographic images ordered to evaluate for the above acute complaints and reviewed radiology reports and findings.  These findings were personally discussed with the patient.  Please see medical record for radiology  report.  ____________________________________________   PROCEDURES  Procedure(s) performed:  Procedures    Critical Care performed: no ____________________________________________   INITIAL IMPRESSION / ASSESSMENT AND PLAN / ED COURSE  Pertinent labs & imaging results that were available during my care of the patient were reviewed by me and considered in my medical decision making (see chart for details).   DDX: Pharyngitis, PTA, Ludwick's, epiglottitis, uvulitis, dehydration, electrolyte abnormality  Sean Terry is a 24 y.o. who presents to the ED with a presentation as described above.  Patient pleasant nontoxic-appearing on my exam  I can visualize the uppermost part of his epiglottis but it appears normal.  His throat does appear mildly erythematous but no exudates.  No signs of PTA.  No trismus.  No sublingual tenderness.  No discomfort of the anterior neck.  Mildly tachycardic patient states he did run in from work.  Clinical Course as of Jul 17 1505  Wed Jul 17, 2020  1249 Patient reassessed.  Minimal normal phonation.  No uvular edema.  No tongue swelling.  Not consistent with Ludwick's.  Tachycardia improved after IV fluids.   [PR]  1442 Patient remains exceedingly well-appearing.  Work-up is reassuring.  I do believe he is stable appropriate for outpatient follow-up.`   [PR]    Clinical Course User Index [PR] Willy Eddy, MD    The patient was evaluated in Emergency Department today for the symptoms described in the history of present illness. He/she was evaluated in the context of the global COVID-19 pandemic, which necessitated consideration that the patient might be at risk for infection with the SARS-CoV-2 virus that causes COVID-19. Institutional protocols and algorithms that pertain to the evaluation of patients at risk for COVID-19 are in a state of rapid change based on information released by regulatory bodies including the CDC and federal and state  organizations. These policies and algorithms were followed during the patient's care in the ED.  As part of my medical decision making, I reviewed the following data within the electronic MEDICAL RECORD NUMBER Nursing notes reviewed and incorporated, Labs reviewed, notes from prior ED visits and [place] Controlled Substance Database   ____________________________________________   FINAL CLINICAL IMPRESSION(S) / ED DIAGNOSES  Final diagnoses:  Sore throat      NEW MEDICATIONS STARTED DURING THIS VISIT:  New Prescriptions   No medications on file     Note:  This document was prepared using Dragon voice recognition software and may include unintentional dictation errors.    Willy Eddy, MD 07/17/20 [id number]

## 2021-12-20 IMAGING — CR DG NECK SOFT TISSUE
2 series · 2 of 2 positions shown · non-contrast
Comparison: None.

CLINICAL DATA: Sore throat

EXAM:
NECK SOFT TISSUES - 1+ VIEW

[neck lat]
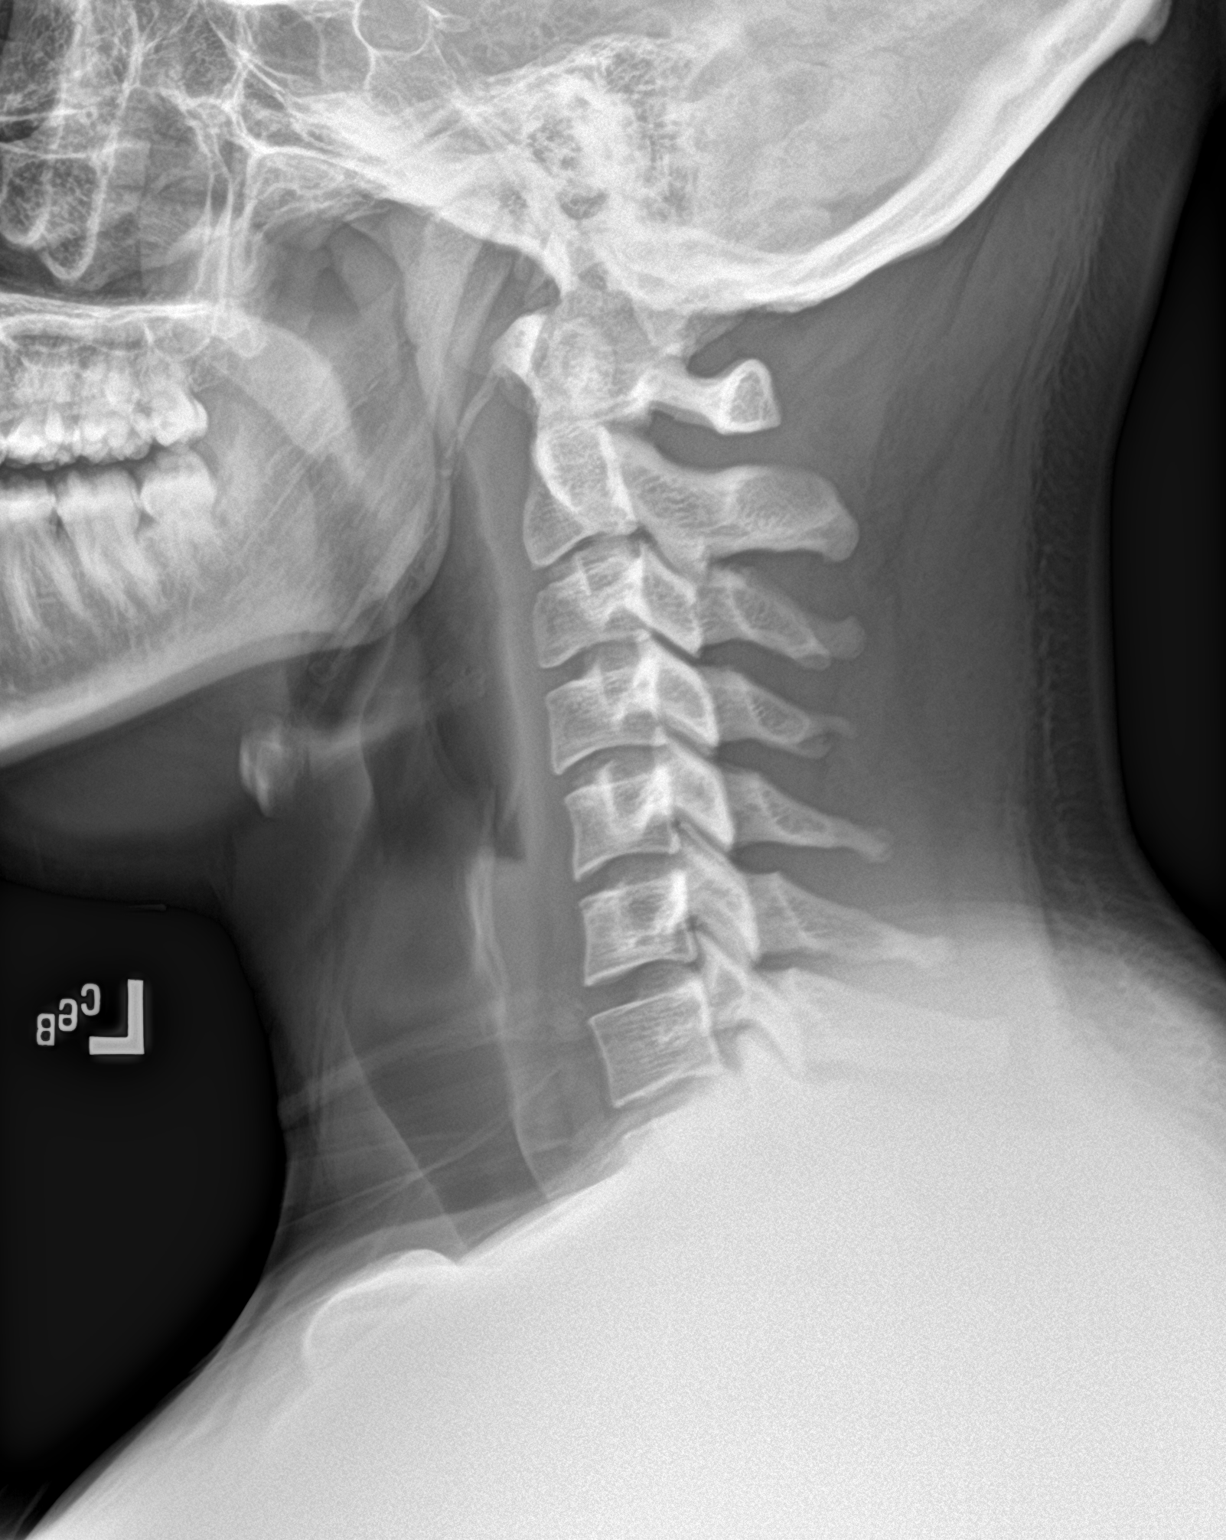

[neck ap]
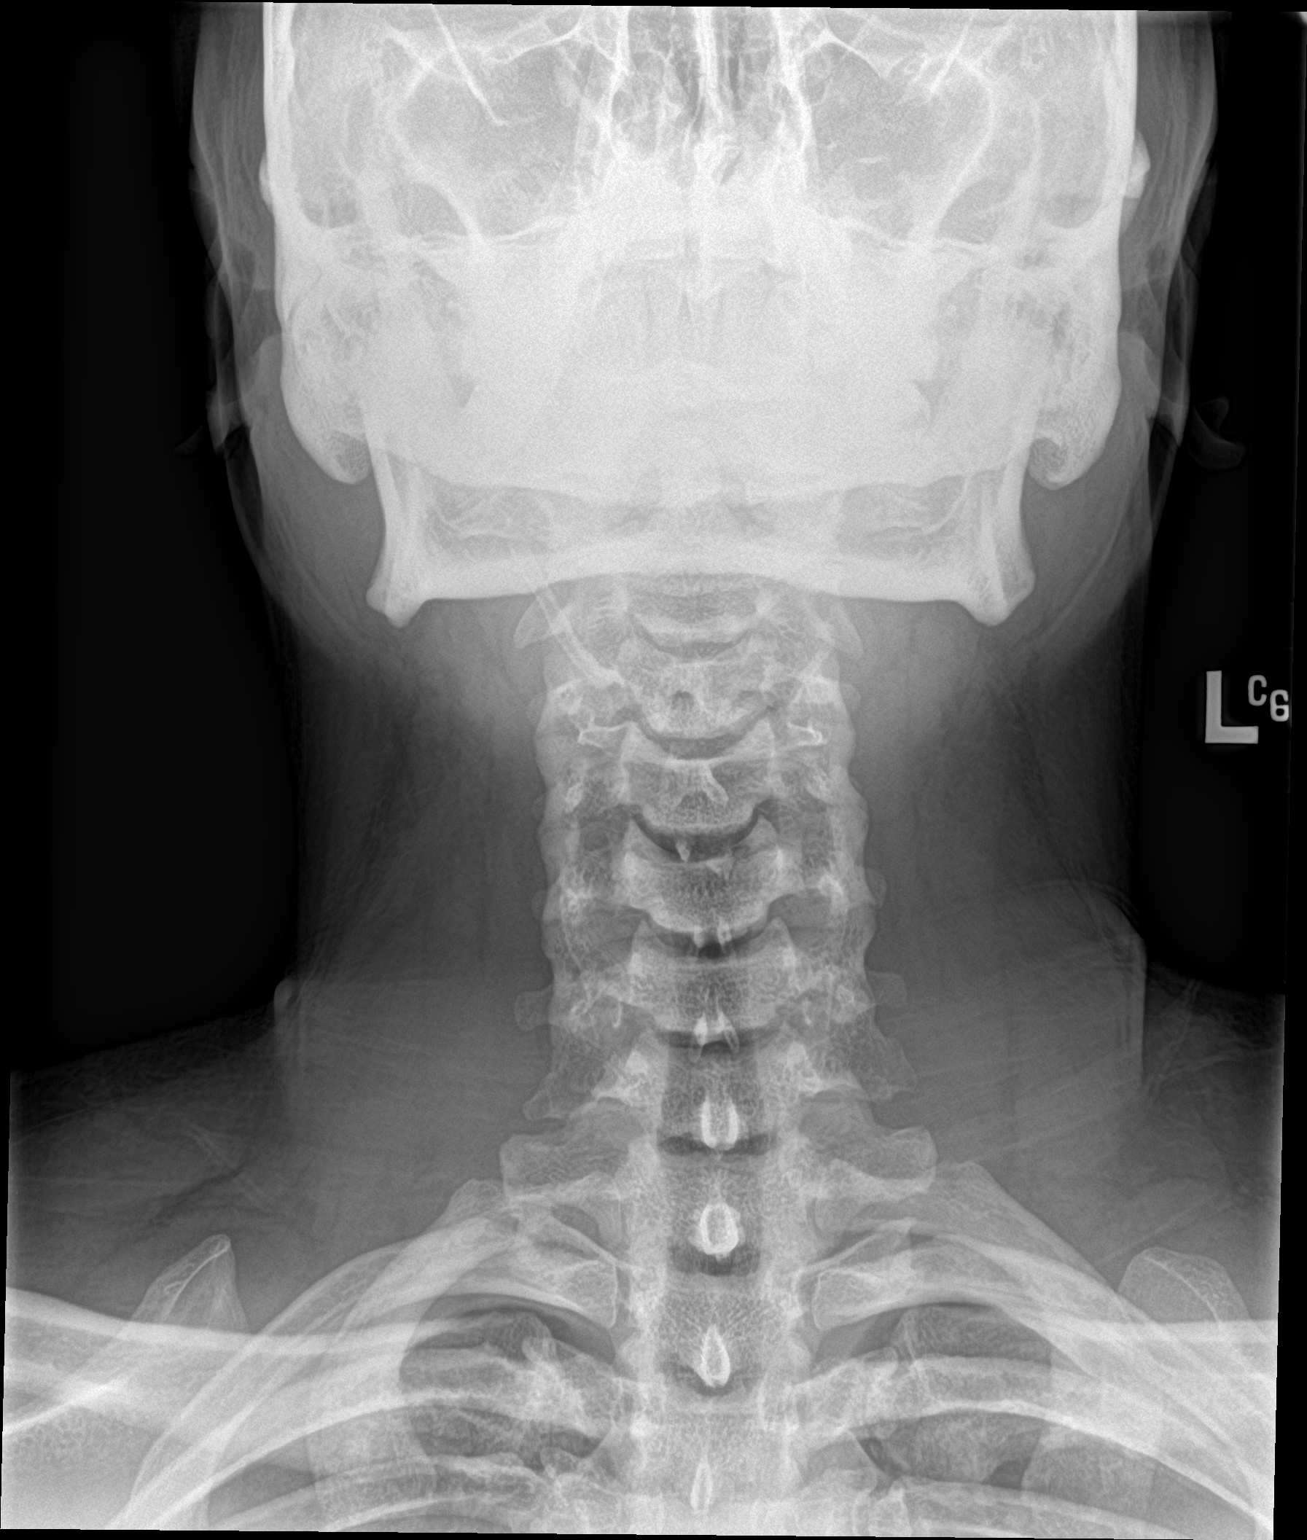

[2 of 2 positions shown; findings below may reference images not displayed]

FINDINGS: There is no evidence of retropharyngeal soft tissue swelling or
epiglottic enlargement. The cervical airway is unremarkable and no
radio-opaque foreign body identified.
IMPRESSION: Negative.

## 2022-11-10 ENCOUNTER — Other Ambulatory Visit: Payer: Self-pay

## 2022-11-10 ENCOUNTER — Encounter: Payer: Self-pay | Admitting: Emergency Medicine

## 2022-11-10 DIAGNOSIS — J101 Influenza due to other identified influenza virus with other respiratory manifestations: Secondary | ICD-10-CM | POA: Insufficient documentation

## 2022-11-10 DIAGNOSIS — Z1152 Encounter for screening for COVID-19: Secondary | ICD-10-CM | POA: Insufficient documentation

## 2022-11-10 DIAGNOSIS — I10 Essential (primary) hypertension: Secondary | ICD-10-CM | POA: Insufficient documentation

## 2022-11-10 NOTE — ED Triage Notes (Signed)
Patient ambulatory to triage with steady gait, without difficulty or distress noted; pt reports since yesterday having chills, cough, HA, and fever; st that he needs a work note

## 2022-11-11 ENCOUNTER — Emergency Department
Admission: EM | Admit: 2022-11-11 | Discharge: 2022-11-11 | Disposition: A | Discharge status: Home / Self Care | Payer: Self-pay | Attending: Emergency Medicine | Admitting: Emergency Medicine

## 2022-11-11 DIAGNOSIS — J101 Influenza due to other identified influenza virus with other respiratory manifestations: Secondary | ICD-10-CM

## 2022-11-11 LAB — RESP PANEL BY RT-PCR (RSV, FLU A&B, COVID)  RVPGX2
Influenza A by PCR: POSITIVE — AB
Influenza B by PCR: NEGATIVE
Resp Syncytial Virus by PCR: NEGATIVE
SARS Coronavirus 2 by RT PCR: NEGATIVE

## 2022-11-11 MED ORDER — ONDANSETRON 8 MG PO TBDP: 8.0000 mg | ORAL_TABLET | Freq: Three times a day (TID) | ORAL | 0 refills | Status: AC | PRN

## 2022-11-11 MED ORDER — ALBUTEROL SULFATE HFA 108 (90 BASE) MCG/ACT IN AERS: 2.0000 | INHALATION_SPRAY | RESPIRATORY_TRACT | 0 refills | Status: AC | PRN

## 2022-11-11 NOTE — Discharge Instructions (Addendum)
Take the ondansetron up to every 8 hours as needed for nausea.  You should use the albuterol inhaler every 4-6 hours for the next several days if you are having shortness of breath or wheezing.  Take 600 mg ibuprofen up to every 6 hours as needed for headache or fever.

## 2022-11-11 NOTE — ED Provider Notes (Signed)
[facility] Provider Note    Event Date/Time   First MD Initiated Contact with Patient 11/11/22 0127     (approximate)   History   Cough   HPI  Sean Terry is a 27 y.o. male with a history of hypertension who presents with cough associated with some shortness of breath, body aches, subjective fever, and malaise for the last 3 days.  He has also had some nausea but no vomiting.  His daughter is ill with influenza.  I reviewed the past medical records.  The patient's most recent outpatient encounter was in 2021 for a COVID test.  He has no recent ED visits or admissions.   Physical Exam   Triage Vital Signs: ED Triage Vitals  Enc Vitals Group     BP 11/10/22 2312 (!) 144/87     Pulse Rate 11/10/22 2312 90     Resp 11/10/22 2312 20     Temp 11/10/22 2312 99 F (37.2 C)     Temp Source 11/10/22 2312 Oral     SpO2 11/10/22 2312 99 %     Weight 11/10/22 2325 230 lb (104.3 kg)     Height 11/10/22 2325 5\' 10"  (1.778 m)     Head Circumference --      Peak Flow --      Pain Score 11/10/22 2325 7     Pain Loc --      Pain Edu? --      Excl. in [place]? --     Most recent vital signs: Vitals:   11/10/22 2312 11/11/22 0234  BP: (!) 144/87 (!) 140/88  Pulse: 90 88  Resp: 20 18  Temp: 99 F (37.2 C)   SpO2: 99% 100%     General: Awake, no distress.  CV:  Good peripheral perfusion.  Resp:  Normal effort.  Faint wheeze and prolonged expiratory phase. Abd:  No distention.  Other:  Oropharynx clear with slight erythema but no exudate.   ED Results / Procedures / Treatments   Labs (all labs ordered are listed, but only abnormal results are displayed) Labs Reviewed  RESP PANEL BY RT-PCR (RSV, FLU A&B, COVID)  RVPGX2 - Abnormal; Notable for the following components:      Result Value   Influenza A by PCR POSITIVE (*)    All other components within normal limits     EKG     RADIOLOGY     PROCEDURES:  Critical Care performed:  No  Procedures   MEDICATIONS ORDERED IN ED: Medications - No data to display   IMPRESSION / MDM / Media / ED COURSE  I reviewed the triage vital signs and the nursing notes.  27 year old male with PMH as noted above presents with flulike symptoms for the last 3 days.  Respiratory panel is positive for influenza A.  The patient has some prolonged expiratory phase and faint wheezing on lung exam so there is likely a component of bronchitis.  Differential diagnosis includes, but is not limited to, influenza A, acute bronchitis.  There is no clinical evidence for pneumonia.  Patient's presentation is most consistent with acute complicated illness / injury requiring diagnostic workup.  The patient is stable for discharge home.  I counseled him on the results of the workup.  I will prescribe Zofran for symptomatic treatment of the nausea as well as albuterol.  He is out of the window for Tamiflu.  I gave strict return precautions and he expressed understanding.  FINAL CLINICAL IMPRESSION(S) / ED DIAGNOSES   Final diagnoses:  Influenza A     Rx / DC Orders   ED Discharge Orders          Ordered    albuterol (VENTOLIN HFA) 108 (90 Base) MCG/ACT inhaler  Every 4 hours PRN        11/11/22 0224    ondansetron (ZOFRAN-ODT) 8 MG disintegrating tablet  Every 8 hours PRN        11/11/22 0224             Note:  This document was prepared using Dragon voice recognition software and may include unintentional dictation errors.    Arta Silence, MD 11/11/22 [phone]

## 2023-04-28 ENCOUNTER — Other Ambulatory Visit: Payer: Self-pay

## 2023-04-28 ENCOUNTER — Emergency Department
Admission: EM | Admit: 2023-04-28 | Discharge: 2023-04-28 | Disposition: A | Discharge status: Home / Self Care | Payer: 59 | Attending: Emergency Medicine | Admitting: Emergency Medicine

## 2023-04-28 ENCOUNTER — Encounter: Payer: Self-pay | Admitting: *Deleted

## 2023-04-28 DIAGNOSIS — H6993 Unspecified Eustachian tube disorder, bilateral: Secondary | ICD-10-CM | POA: Diagnosis not present

## 2023-04-28 DIAGNOSIS — R42 Dizziness and giddiness: Secondary | ICD-10-CM | POA: Diagnosis present

## 2023-04-28 LAB — BASIC METABOLIC PANEL
Anion gap: 9 (ref 5–15)
BUN: 13 mg/dL (ref 6–20)
CO2: 23 mmol/L (ref 22–32)
Calcium: 9 mg/dL (ref 8.9–10.3)
Chloride: 105 mmol/L (ref 98–111)
Creatinine, Ser: 1.17 mg/dL (ref 0.61–1.24)
GFR, Estimated: >60 mL/min (ref >60)
Glucose, Bld: 100 mg/dL — ABNORMAL HIGH (ref 70–99)
Potassium: 3.9 mmol/L (ref 3.5–5.1)
Sodium: 137 mmol/L (ref 135–145)

## 2023-04-28 LAB — CBC
HCT: 43.5 % (ref 39.0–52.0)
Hemoglobin: 14.5 g/dL (ref 13.0–17.0)
MCH: 30.7 pg (ref 26.0–34.0)
MCHC: 33.3 g/dL (ref 30.0–36.0)
MCV: 92.2 fL (ref 80.0–100.0)
Platelets: 231 10*3/uL (ref 150–400)
RBC: 4.72 MIL/uL (ref 4.22–5.81)
RDW: 13 % (ref 11.5–15.5)
WBC: 6.2 10*3/uL (ref 4.0–10.5)
nRBC: 0 % (ref 0.0–0.2)

## 2023-04-28 LAB — TROPONIN I (HIGH SENSITIVITY): Troponin I (High Sensitivity): 3 ng/L (ref <18)

## 2023-04-28 MED ORDER — CETIRIZINE HCL 10 MG PO TABS: 10.0000 mg | ORAL_TABLET | Freq: Every day | ORAL | 0 refills | Status: AC

## 2023-04-28 MED ORDER — FLUTICASONE PROPIONATE 50 MCG/ACT NA SUSP: 1.0000 | Freq: Two times a day (BID) | NASAL | 0 refills | Status: AC

## 2023-04-28 MED ORDER — MECLIZINE HCL 50 MG PO TABS: 50.0000 mg | ORAL_TABLET | Freq: Three times a day (TID) | ORAL | 0 refills | Status: AC | PRN

## 2023-04-28 NOTE — ED Provider Notes (Signed)
[facility] Provider Note  Patient Contact: 10:12 PM (approximate)   History   Dizziness   HPI  Sean Terry is a 27 y.o. male who presents the emergency department complaining of some dizziness.  Patient states that he typically has some increased dizziness when his allergies are flared.  He did have some cold-like symptoms earlier this week, but feels primarily like he has had some increased allergy symptoms.  Today at work he was feeling dizzy.  No headache, visual changes, unilateral weakness, slurred speech, facial droop or other symptoms at this time.     Physical Exam   Triage Vital Signs: ED Triage Vitals [04/28/23 1925]  Enc Vitals Group     BP (!) 158/94     Pulse Rate 82     Resp 18     Temp 98.4 F (36.9 C)     Temp Source Oral     SpO2 98 %     Weight 220 lb (99.8 kg)     Height 5\' 9"  (1.753 m)     Head Circumference      Peak Flow      Pain Score 0     Pain Loc      Pain Edu?      Excl. in [place]?     Most recent vital signs: Vitals:   04/28/23 1925 04/28/23 2133  BP: (!) 158/94 (!) 149/88  Pulse: 82 79  Resp: 18 16  Temp: 98.4 F (36.9 C)   SpO2: 98% 100%     General: Alert and in no acute distress. Eyes:  PERRL. EOMI. Head: No acute traumatic findings ENT:      Ears: EACs unremarkable bilaterally.  TMs are slightly bulging worse on the right than left.  No injection.      Nose: No congestion/rhinnorhea.      Mouth/Throat: Mucous membranes are moist. Neck: No stridor. No cervical spine tenderness to palpation.  Cardiovascular:  Good peripheral perfusion Respiratory: Normal respiratory effort without tachypnea or retractions. Lungs CTAB.  Musculoskeletal: Full range of motion to all extremities.  Neurologic:  No gross focal neurologic deficits are appreciated.  Cranial nerves grossly intact Skin:   No rash noted Other:   ED Results / Procedures / Treatments   Labs (all labs ordered are listed, but only abnormal  results are displayed) Labs Reviewed  BASIC METABOLIC PANEL - Abnormal; Notable for the following components:      Result Value   Glucose, Bld 100 (*)    All other components within normal limits  CBC  TROPONIN I (HIGH SENSITIVITY)  TROPONIN I (HIGH SENSITIVITY)     EKG     RADIOLOGY    No results found.  PROCEDURES:  Critical Care performed: No  Procedures   MEDICATIONS ORDERED IN ED: Medications - No data to display   IMPRESSION / MDM / ASSESSMENT AND PLAN / ED COURSE  I reviewed the triage vital signs and the nursing notes.                                 Differential diagnosis includes, but is not limited to, vertigo, eustachian tube dysfunction, CVA   Patient's presentation is most consistent with acute presentation with potential threat to life or bodily function.   Patient's diagnosis is consistent with eustachian tube dysfunction, vertigo.  Patient presents emergency department complaining of vertigo-like symptoms.  Patient has the symptoms developed  when he has increased allergies.  Has not tried any allergy medications though he does note increased congestion and sneezing.  Findings on exam are consistent with eustachian tube dysfunction bilaterally but worse on the right than left.  Patient has no concerning neurodeficits.  At this time we will treat symptomatically with allergy medications, meclizine.  Return precautions for concerning signs for neurodeficits are discussed with the patient.  Follow-up primary care as needed..  Patient is given ED precautions to return to the ED for any worsening or new symptoms.     FINAL CLINICAL IMPRESSION(S) / ED DIAGNOSES   Final diagnoses:  Dysfunction of both eustachian tubes     Rx / DC Orders   ED Discharge Orders          Ordered    cetirizine (ZYRTEC) 10 MG tablet  Daily        04/28/23 2219    fluticasone (FLONASE) 50 MCG/ACT nasal spray  2 times daily        04/28/23 2219    meclizine  (ANTIVERT) 50 MG tablet  3 times daily PRN        04/28/23 2219             Note:  This document was prepared using Dragon voice recognition software and may include unintentional dictation errors.   Lanette Hampshire 04/28/23 2220    Pilar Jarvis, MD 04/29/23 1119

## 2023-04-28 NOTE — ED Triage Notes (Signed)
Pt reports dizziness and nausea since yesterday.  Pt states he feels lightheaded.  No chest pain or sob.  Headache yesterday.  Pt alert  speech clear.

## 2024-01-13 ENCOUNTER — Ambulatory Visit: Payer: Self-pay | Admitting: Nurse Practitioner

## 2024-01-13 DIAGNOSIS — Z113 Encounter for screening for infections with a predominantly sexual mode of transmission: Secondary | ICD-10-CM

## 2024-01-13 LAB — HEPATITIS B SURFACE ANTIGEN

## 2024-01-13 LAB — HM HIV SCREENING LAB: HM HIV Screening: NEGATIVE

## 2024-01-13 LAB — HM HEPATITIS C SCREENING LAB: HM Hepatitis Screen: NEGATIVE

## 2024-01-13 NOTE — Progress Notes (Signed)
 Pt is here for STD screening. Patient declined condoms and opportunity given to ask questions for any clarifications. Sonda Primes, RN.

## 2024-01-17 LAB — C. TRACHOMATIS NAA, CONFIRM: C. trachomatis NAA, Confirm: POSITIVE — AB

## 2024-01-17 LAB — CHLAMYDIA/GC NAA, CONFIRMATION
Chlamydia trachomatis, NAA: POSITIVE — AB
Neisseria gonorrhoeae, NAA: NEGATIVE

## 2024-01-18 ENCOUNTER — Telehealth: Payer: Self-pay

## 2024-01-18 ENCOUNTER — Telehealth: Payer: Self-pay | Admitting: Family Medicine

## 2024-01-18 NOTE — Telephone Encounter (Signed)
 I missed your call can you please call me back for my results

## 2024-01-18 NOTE — Progress Notes (Unsigned)
 Pt has been reached out regarding lab results and has been informed to call the front desk to make appointment for treatment by 01/19/2024.

## 2024-01-18 NOTE — Telephone Encounter (Signed)
 Call pt re positive CT result from 01/13/24 urine specimen.  Needs Tx.  Schedule appt.  TOC ~ 3 months. Partner tx.

## 2024-01-18 NOTE — Telephone Encounter (Signed)
 Phone call to pt at [phone], no answer. Mailbox full, unable to leave voicemail. Tried twice.  MyChart not set up.

## 2024-01-19 NOTE — Progress Notes (Signed)
 [facility] Department STI clinic [address] [place] [address] Main phone: [phone]  STI screening visit  Subjective:  Anias Bartol is a 28 y.o. male being seen today for an STI screening visit. The patient reports they do not have symptoms.    Patient has the following medical conditions:  There are no active problems to display for this patient.   Chief Complaint  Patient presents with   SEXUALLY TRANSMITTED DISEASE    No symptoms   Patient is a pleasant 28 y.o. male who presents to the office today requesting asymptomatic STI testing. He reports 2 male partners in the last 2 months, and practices penile/vaginal penetrative sex. Patient reports using condoms sometimes. Patient indicates no STI history. He reports last sex was 12/16/23.   STI screening history: Last HIV test per patient/review of record was No results found for: "HMHIVSCREEN" No results found for: "HIV"  Last HEPC test per patient/review of record was No results found for: "HMHEPCSCREEN" No components found for: "HEPC"   Last HEPB test per patient/review of record was No components found for: "HMHEPBSCREEN"   Fertility: Does the patient or their partner desires a pregnancy in the next year? No  Screening for MPX risk: Does the patient have an unexplained rash? No Is the patient MSM? No Does the patient endorse multiple sex partners or anonymous sex partners? Yes Did the patient have close or sexual contact with a person diagnosed with MPX? No Has the patient traveled outside the [place] where MPX is endemic? No Is there a high clinical suspicion for MPX-- evidenced by one of the following No  -Unlikely to be chickenpox  -Lymphadenopathy  -Rash that present in same phase of evolution on any given body part   See flowsheet for further details and programmatic requirements.    There is no immunization history on file for this patient.   The following portions of  the patient's history were reviewed and updated as appropriate: allergies, current medications, past medical history, past social history, past surgical history and problem list.  Objective:  There were no vitals filed for this visit.  Physical Exam Nursing note reviewed.  Constitutional:      Appearance: Normal appearance.  HENT:     Head: Normocephalic.     Salivary Glands: Right salivary gland is not diffusely enlarged or tender. Left salivary gland is not diffusely enlarged or tender.     Mouth/Throat:     Lips: Pink. No lesions.     Mouth: Mucous membranes are moist. No oral lesions.     Tongue: No lesions. Tongue does not deviate from midline.     Pharynx: Oropharynx is clear. Uvula midline. No oropharyngeal exudate or posterior oropharyngeal erythema.     Tonsils: No tonsillar exudate.  Eyes:     General:        Right eye: No discharge.        Left eye: No discharge.     Conjunctiva/sclera:     Right eye: Right conjunctiva is not injected. No exudate.    Left eye: Left conjunctiva is not injected. No exudate. Pulmonary:     Effort: Pulmonary effort is normal.  Genitourinary:    Comments: Patient asymptomatic and declines genital exam.  Lymphadenopathy:     Head:     Right side of head: No submental, submandibular, tonsillar, preauricular or posterior auricular adenopathy.     Left side of head: No submental, submandibular, tonsillar, preauricular or posterior auricular  adenopathy.     Cervical: No cervical adenopathy.     Right cervical: No superficial or posterior cervical adenopathy.    Left cervical: No superficial or posterior cervical adenopathy.     Upper Body:     Right upper body: No supraclavicular or axillary adenopathy.     Left upper body: No supraclavicular or axillary adenopathy.  Skin:    General: Skin is warm and dry.     Findings: No lesion or rash.     Comments: Skin tone appropriate for ethnicity. Assessed exposed areas and back only.    Neurological:     Mental Status: He is alert and oriented to person, place, and time.  Psychiatric:        Attention and Perception: Attention and perception normal.        Mood and Affect: Mood and affect normal.        Speech: Speech normal.        Behavior: Behavior normal. Behavior is cooperative.        Thought Content: Thought content normal.     Assessment and Plan:  Antoino Westhoff is a 28 y.o. male presenting to the [facility] Department for STI screening  1. Screening for venereal disease (Primary)  - HBV Antigen/Antibody [facility] - HIV/HCV [facility] - Syphilis Serology, [facility] - Chlamydia/GC NAA, Confirmation - C. trachomatis NAA, Confirm  Patient does not have STI symptoms Patient accepted all screenings including  urine GC/Chlamydia, and blood work for HIV/Syphilis. Patient meets criteria for HepB screening? Yes. Ordered? yes Patient meets criteria for HepC screening? Yes. Ordered? yes Recommended condom use with all sex Discussed importance of condom use for STI prevention  Treat positive test results per standing order. Discussed time line for State Lab results and that patient will be called with positive results and encouraged patient to call if he had not heard in 2 weeks Recommended repeat testing in 3 months with positive results. Recommended returning for continued or worsening symptoms.   Return if symptoms worsen or fail to improve.  Future Appointments  Date Time Provider Department Center  01/20/2024  2:20 PM [facility] PROVIDER [facility] None   Total time with patient 20 minutes.   Edmonia James, NP

## 2024-01-20 ENCOUNTER — Ambulatory Visit: Payer: Self-pay

## 2024-01-20 DIAGNOSIS — A749 Chlamydial infection, unspecified: Secondary | ICD-10-CM

## 2024-01-20 MED ORDER — DOXYCYCLINE HYCLATE 100 MG PO TABS: 100.0000 mg | ORAL_TABLET | Freq: Two times a day (BID) | ORAL | Status: AC

## 2024-01-20 NOTE — Progress Notes (Signed)
 Pt is here for Treatment of positive Chlamydia. The patient has been dispensed Doxycycline 100 mg 2x/day for 7 days today. I provided counseling today regarding the medication. We discussed the medication, the side effects and when to call clinic. Patient given the opportunity to ask questions for any clarification. Condoms declined, brochure and contact card given. Sonda Primes, RN

## 2024-01-20 NOTE — Telephone Encounter (Signed)
 Phone call to pt at [phone]. Pt answered and confirmed password. Counseled pt re + CT results and offered to schedule appt. Pt stated he had already spoken with someone and has an appt with [facility] this afternoon. Pt counseled about partner needing tx. Pt expressed understanding and will let partner know. Discussed TOC in ~3 months.

## 2024-01-20 NOTE — Progress Notes (Signed)
  [facility] STI clinic [address] [place] [address] Main phone: [phone]  STI screening visit  Subjective:  Elizjah Noblet is a 28 y.o. male being seen today for treatment for Chlamydia found on last visit about 1 week ago. At the time the patient did not have symptoms.   Patient has the following medical conditions:  There are no active problems to display for this patient.   Chief Complaint  Patient presents with   SEXUALLY TRANSMITTED DISEASE   STI screening history: Last HIV test per patient/review of record was No results found for: "HMHIVSCREEN" No results found for: "HIV"  Last HEPC test per patient/review of record was No results found for: "HMHEPCSCREEN" No components found for: "HEPC"   Last HEPB test per patient/review of record was No components found for: "HMHEPBSCREEN"   See flowsheet for further details and programmatic requirements.    There is no immunization history on file for this patient.   The following portions of the patient's history were reviewed and updated as appropriate: allergies, current medications, past medical history, past social history, past surgical history and problem list.  Objective:  There were no vitals filed for this visit.    Assessment and Plan:  Yojan Paskett is a 28 y.o. male presenting to the [facility] for STI treatment.  1. Chlamydia (Primary) Treating per CDC STI guidelines. Pt seen by RN only and treated according to S.O.   - doxycycline (VIBRA-TABS) 100 MG tablet; Take 1 tablet (100 mg total) by mouth 2 (two) times daily for 7 days.  No follow-ups on file.  No future appointments.  Edmonia James, NP

## 2024-01-28 ENCOUNTER — Other Ambulatory Visit: Payer: Self-pay

## 2024-01-28 ENCOUNTER — Emergency Department
Admission: EM | Admit: 2024-01-28 | Discharge: 2024-01-28 | Disposition: A | Discharge status: Home / Self Care | Payer: Self-pay | Attending: Emergency Medicine | Admitting: Emergency Medicine

## 2024-01-28 ENCOUNTER — Encounter: Payer: Self-pay | Admitting: Emergency Medicine

## 2024-01-28 DIAGNOSIS — I10 Essential (primary) hypertension: Secondary | ICD-10-CM | POA: Insufficient documentation

## 2024-01-28 DIAGNOSIS — K047 Periapical abscess without sinus: Secondary | ICD-10-CM | POA: Insufficient documentation

## 2024-01-28 DIAGNOSIS — K029 Dental caries, unspecified: Secondary | ICD-10-CM | POA: Insufficient documentation

## 2024-01-28 MED ORDER — AMOXICILLIN-POT CLAVULANATE 875-125 MG PO TABS
1.0000 | ORAL_TABLET | Freq: Once | ORAL | Status: AC
  Administered 2024-01-28: 1 via ORAL
  Filled 2024-01-28: qty 1

## 2024-01-28 MED ORDER — IBUPROFEN 800 MG PO TABS: 800.0000 mg | ORAL_TABLET | Freq: Three times a day (TID) | ORAL | 0 refills | Status: AC | PRN

## 2024-01-28 MED ORDER — AMOXICILLIN-POT CLAVULANATE 875-125 MG PO TABS: 1.0000 | ORAL_TABLET | Freq: Two times a day (BID) | ORAL | 0 refills | Status: AC

## 2024-01-28 NOTE — ED Notes (Signed)
 See triage note  Presents with dental pain and swelling  States the pain is coming from wisdom teeth

## 2024-01-28 NOTE — ED Triage Notes (Signed)
 Pt via POV from home. Pt c/o L lower jaw pain and swelling started 2 days ago. Reports needing to get his wisdom teeth removed has an appointment scheduled at the end of May. Swelling noted to the L lower jaw. Pt is A&Ox4 and NAD, ambulatory to triage.

## 2024-01-28 NOTE — ED Provider Notes (Signed)
 [facility] Emergency Department Provider Note     Event Date/Time   First MD Initiated Contact with Patient 01/28/24 1109     (approximate)   History   Dental Pain   HPI  Sean Terry is a 28 y.o. male with a history of hypertension, presents to the ED endorsing left lower jaw pain and swelling.  He reports onset 2 days prior, with history of known dental caries and poor dentition.  Patient denies any fevers, chills, or sweats.  No difficulty breathing, swallowing, controlling your secretions.  He localizes pain to his left lower molar which has a central cavity on the occlusal surface.  The swelling in his lower jaw and cheek is made difficult for him to fully open his mouth.  Patient reports he scheduled to see dental provider at the end of May.  Physical Exam   Triage Vital Signs: ED Triage Vitals  Encounter Vitals Group     BP 01/28/24 1051 (!) 150/98     Systolic BP Percentile --      Diastolic BP Percentile --      Pulse Rate 01/28/24 1051 (!) 104     Resp 01/28/24 1051 17     Temp 01/28/24 1051 98.6 F (37 C)     Temp Source 01/28/24 1051 Oral     SpO2 01/28/24 1051 98 %     Weight 01/28/24 1053 225 lb (102.1 kg)     Height 01/28/24 1053 5\' 10"  (1.778 m)     Head Circumference --      Peak Flow --      Pain Score 01/28/24 1056 8     Pain Loc --      Pain Education --      Exclude from Growth Chart --     Most recent vital signs: Vitals:   01/28/24 1051  BP: (!) 150/98  Pulse: (!) 104  Resp: 17  Temp: 98.6 F (37 C)  SpO2: 98%    General Awake, no distress. NAD HEENT NCAT. PERRL. EOMI. No rhinorrhea. Mucous membranes are moist.  Uvula is midline and tonsils are flat.  No oropharyngeal lesions appreciated.  Patient with a large cavity defect to the left lower third molar.  Local gum swelling to the mandible jawline is appreciated.  No focal gum swelling or pointing is appreciated.  No sublingual edema is noted. CV:  Good  peripheral perfusion. RRR RESP:  Normal effort. CTA ABD:  No distention.   ED Results / Procedures / Treatments   Labs (all labs ordered are listed, but only abnormal results are displayed) Labs Reviewed - No data to display   EKG   RADIOLOGY   No results found.   PROCEDURES:  Critical Care performed: No  Procedures   MEDICATIONS ORDERED IN ED: Medications  amoxicillin-clavulanate (AUGMENTIN) 875-125 MG per tablet 1 tablet (1 tablet Oral Given 01/28/24 1156)     IMPRESSION / MDM / ASSESSMENT AND PLAN / ED COURSE  I reviewed the triage vital signs and the nursing notes.                              Differential diagnosis includes, but is not limited to, dental caries, dental fracture, dental infection, pericarditis, Ludwig's angina, tonsillitis, sialoadenitis  Patient's presentation is most consistent with acute presentation with potential threat to life or bodily function.  Patient's diagnosis is consistent with dental infection secondary to dental  caries.  Patient with multiple caries including enamel erosion of multiple molars, presents with focal swelling to the left lower jaw.  Patient in no acute respiratory distress.  Able to control oral secretions at this time.  No airway compromise or expanding soft tissue infection appreciated.  Patient will be discharged home with prescriptions for Augmentin and IBU 800. Patient is to follow up with his intended dental provider or 1 for the list provided, as needed or otherwise directed. Patient is given ED precautions to return to the ED for any worsening or new symptoms.   FINAL CLINICAL IMPRESSION(S) / ED DIAGNOSES   Final diagnoses:  Dental infection  Pain due to dental caries     Rx / DC Orders   ED Discharge Orders          Ordered    amoxicillin-clavulanate (AUGMENTIN) 875-125 MG tablet  2 times daily        01/28/24 1148    ibuprofen (ADVIL) 800 MG tablet  Every 8 hours PRN        01/28/24 1148              Note:  This document was prepared using Dragon voice recognition software and may include unintentional dictation errors.    Lissa Hoard, PA-C 01/28/24 1249    Sharyn Creamer, MD 01/28/24 1527

## 2024-01-28 NOTE — Discharge Instructions (Addendum)
 Take the antibiotic until all pills are completed.  Follow-up with your dental provider or one of the clinics listed below.  OPTIONS FOR DENTAL FOLLOW UP CARE  [place] [facility] - Local Safety Net Dental Clinics [email or web address]   [facility] [phone])  Sharl Ma [phone])  [facility] [phone])  [facility] [phone])  [facility] [phone]) This clinic caters to the indigent population and is on a lottery system. Location: [employer] of Dentistry, [employer], [address], [place] Clinic Hours: Wednesdays from 6pm - 9pm, patients seen by a lottery system. For dates, call or go to [email or web address] Services: Cleanings, fillings and simple extractions. Payment Options: DENTAL WORK IS FREE OF CHARGE. Bring proof of income or support. Best way to get seen: Arrive at 5:15 pm - this is a lottery, NOT first come/first serve, so arriving earlier will not increase your chances of being seen.     [facility] [phone] Select option 1 for emergencies   Location: [facility] of Dentistry, [place], [address], [place] Clinic Hours: No walk-ins accepted - call the day before to schedule an appointment. Check in times are 9:30 am and 1:30 pm. Services: Simple extractions, temporary fillings, pulpectomy/pulp debridement, uncomplicated abscess drainage. Payment Options: PAYMENT IS DUE AT THE TIME OF SERVICE.  Fee is usually $100-200, additional surgical procedures (e.g. abscess drainage) may be extra. Cash, checks, Visa/MasterCard accepted.  Can file Medicaid if patient is covered for dental - patient should call case worker to check. No discount for [facility] patients. Best way to get seen: MUST call the day before and get onto the  schedule. Can usually be seen the next 1-2 days. No walk-ins accepted.     [facility] [phone]   Location: [facility], [address] [place] Clinic Hours: M, W, Th, F 8am or 1:30pm, Tues 9a or 1:30 - first come/first served. Services: Simple extractions, temporary fillings, uncomplicated abscess drainage.  You do not need to be an [facility] resident. Payment Options: PAYMENT IS DUE AT THE TIME OF SERVICE. Dental insurance, otherwise sliding scale - bring proof of income or support. Depending on income and treatment needed, cost is usually $50-200. Best way to get seen: Arrive early as it is first come/first served.     [facility] [phone]   Location: [address] Clinic Hours: Mon-Thu 8a-5p Services: Most basic dental services including extractions and fillings. Payment Options: PAYMENT IS DUE AT THE TIME OF SERVICE. Sliding scale, up to 50% off - bring proof if income or support. Medicaid with dental option accepted. Best way to get seen: Call to schedule an appointment, can usually be seen within 2 weeks OR they will try to see walk-ins - show up at 8a or 2p (you may have to wait).     [facility] [phone] [place] RESIDENTS ONLY   Location: [facility], [address], [place], [place] [address] Clinic Hours: By appointment only. Monday - Thursday 8am-5pm, Friday 8am-12pm Services: Cleanings, fillings, extractions. Payment Options: PAYMENT IS DUE AT THE TIME OF SERVICE. Cash, Visa or MasterCard. Sliding scale - $30 minimum per service. Best way to get seen: Come in to office, complete packet and make an appointment - need proof of income or support monies for each household member and proof of [facility] residence. Usually takes  about a month to get in.     [facility] [phone]    Location: [address], [facility] Clinic Hours: Walk-in Urgent Care Dental Services are offered Monday-Friday mornings only. The numbers of emergencies accepted daily is limited to the number of providers available. Maximum 15 - Mondays, Wednesdays & Thursdays Maximum 10 - Tuesdays & Fridays Services: You do not need to be a [facility] resident to be seen for a dental emergency. Emergencies are defined as pain, swelling, abnormal bleeding, or dental trauma. Walkins will receive x-rays if needed. NOTE: Dental cleaning is not an emergency. Payment Options: PAYMENT IS DUE AT THE TIME OF SERVICE. Minimum co-pay is $40.00 for uninsured patients. Minimum co-pay is $3.00 for Medicaid with dental coverage. Dental Insurance is accepted and must be presented at time of visit. Medicare does not cover dental. Forms of payment: Cash, credit card, checks. Best way to get seen: If not previously registered with the clinic, walk-in dental registration begins at 7:15 am and is on a first come/first serve basis. If previously registered with the clinic, call to make an appointment.     [facility] [phone] [place] RESIDENTS ONLY   Location: [address], [place], [place] Clinic Hours: Mon-Thu 10a-2p Services: Extractions only! Payment Options: FREE (donations accepted) - bring proof of income or support Best way to get seen: Call and schedule an appointment OR come at 8am on the 1st Monday of every month (except for holidays) when it is first come/first served.     [facility] [phone]   Location: [address] [place], [place] Clinic Hours: Friday mornings Services, Payment Options, Best way to get seen: Call for info
